# Patient Record
Sex: Female | Born: 1940 | Race: Black or African American | Hispanic: No | Smoking: Former smoker
Health system: Southern US, Community
[De-identification: ages and names within clinical notes are randomized; demographics above are authoritative.]

## PROBLEM LIST (undated history)

## (undated) DIAGNOSIS — E7800 Pure hypercholesterolemia, unspecified: Secondary | ICD-10-CM

## (undated) DIAGNOSIS — I10 Essential (primary) hypertension: Secondary | ICD-10-CM

## (undated) HISTORY — PX: OTHER SURGICAL HISTORY: SHX169

## (undated) HISTORY — PX: JOINT REPLACEMENT: SHX530

## (undated) HISTORY — PX: TOTAL HIP ARTHROPLASTY: SHX124

## (undated) HISTORY — PX: REPLACEMENT TOTAL KNEE BILATERAL: SUR1225

## (undated) HISTORY — PX: ABDOMINAL HYSTERECTOMY: SHX81

---

## 2017-04-14 ENCOUNTER — Other Ambulatory Visit: Payer: Self-pay

## 2017-04-14 ENCOUNTER — Encounter (HOSPITAL_COMMUNITY): Payer: Self-pay | Admitting: Family Medicine

## 2017-04-14 ENCOUNTER — Ambulatory Visit (HOSPITAL_COMMUNITY)
Admission: EM | Admit: 2017-04-14 | Discharge: 2017-04-14 | Disposition: A | Discharge status: Home / Self Care | Payer: Medicare Other | Attending: Family Medicine | Admitting: Family Medicine

## 2017-04-14 ENCOUNTER — Ambulatory Visit (INDEPENDENT_AMBULATORY_CARE_PROVIDER_SITE_OTHER): Payer: Medicare Other

## 2017-04-14 DIAGNOSIS — M25511 Pain in right shoulder: Secondary | ICD-10-CM

## 2017-04-14 HISTORY — DX: Pure hypercholesterolemia, unspecified: E78.00

## 2017-04-14 HISTORY — DX: Essential (primary) hypertension: I10

## 2017-04-14 MED ORDER — HYDROCODONE-ACETAMINOPHEN 5-325 MG PO TABS: 1.0000 | ORAL_TABLET | Freq: Four times a day (QID) | ORAL | 0 refills | Status: AC | PRN

## 2017-04-14 NOTE — Discharge Instructions (Signed)
Follow up with your orthopedist in [place].    CLINICAL DATA:  Status post fall today with right shoulder pain.   EXAM: RIGHT SHOULDER - 2+ VIEW   COMPARISON:  None.   FINDINGS: There is no evidence of fracture or dislocation. Degenerative joint changes of the right shoulder noted. Soft tissues are unremarkable.   IMPRESSION: No acute fracture or dislocation.     Electronically Signed   By: Sherian ReinWei-Chen  Lin M.D.   On: 04/14/2017 19:57

## 2017-04-14 NOTE — ED Provider Notes (Signed)
  [facility]   [id number] 04/14/17 Arrival Time: 1912   SUBJECTIVE:  Cassidy GunnerGwendolyn Hendricks is a 77 y.o. female who presents to the urgent care with complaint of right shoulder pain after a fall.  She was walking to the [employer] parking lot after dinner and fell.  She has a h/o rotator cuff injury in right shoulder that had healed with physcial therapy, and biceps tendon rupture left shoulder.  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    Past Medical History:  Diagnosis Date  . Hypercholesteremia   . Hypertension    No family history on file. Social History   Socioeconomic History  . Marital status: Divorced    Spouse name: Not on file  . Number of children: Not on file  . Years of education: Not on file  . Highest education level: Not on file  Social Needs  . Financial resource strain: Not on file  . Food insecurity - worry: Not on file  . Food insecurity - inability: Not on file  . Transportation needs - medical: Not on file  . Transportation needs - non-medical: Not on file  Occupational History  . Not on file  Tobacco Use  . Smoking status: Former Smoker  Substance and Sexual Activity  . Alcohol use: Yes    Comment: occasionally  . Drug use: Not on file  . Sexual activity: Not on file  Other Topics Concern  . Not on file  Social History Narrative  . Not on file   Current Meds  Medication Sig  . Acetaminophen (TYLENOL PO) Take by mouth.  . ATORVASTATIN CALCIUM PO Take by mouth.  . Celecoxib (CELEBREX PO) Take by mouth.  Marland Kitchen. LISINOPRIL-HYDROCHLOROTHIAZIDE PO Take by mouth.   No Known Allergies    ROS: As per HPI, remainder of ROS negative.   OBJECTIVE:   Vitals:   04/14/17 1930  BP: (!) 169/83  Pulse: 71  Resp: 18  Temp: 98.2 F (36.8 C)  TempSrc: Oral  SpO2: 100%     General appearance: alert; no distress Eyes: PERRL; EOMI; conjunctiva normal HENT: normocephalic; atraumatic;  Neck:  supple Extremities: no cyanosis or edema; symmetrical with no gross deformities, nontender right shoulder,  Skin: warm and dry Neurologic: normal gait; grossly normal Psychological: alert and cooperative; normal mood and affect    Labs Reviewed - No data to display  Dg Shoulder Right  Result Date: 04/14/2017 CLINICAL DATA:  Status post fall today with right shoulder pain. EXAM: RIGHT SHOULDER - 2+ VIEW COMPARISON:  None. FINDINGS: There is no evidence of fracture or dislocation. Degenerative joint changes of the right shoulder noted. Soft tissues are unremarkable. IMPRESSION: No acute fracture or dislocation. Electronically Signed   By: Sherian ReinWei-Chen  Lin M.D.   On: 04/14/2017 19:57       ASSESSMENT & PLAN:  1. Acute pain of right shoulder     Meds ordered this encounter  Medications  . HYDROcodone-acetaminophen (NORCO) 5-325 MG tablet    Sig: Take 1 tablet by mouth every 6 (six) hours as needed for moderate pain.    Dispense:  12 tablet    Refill:  0    Reviewed expectations re: course of current medical issues. Questions answered. Outlined signs and symptoms indicating need for more acute intervention. Patient verbalized understanding. After Visit Summary given.      Elvina SidleLauenstein, Alysandra Lobue, MD 04/14/17 2010

## 2017-04-14 NOTE — ED Triage Notes (Signed)
Assessment per Dr Lauenstein. 

## 2019-03-14 IMAGING — DX DG SHOULDER 2+V*R*
3 series · 3 of 3 positions shown · non-contrast
Comparison: None.

CLINICAL DATA: Status post fall today with right shoulder pain.

EXAM:
RIGHT SHOULDER - 2+ VIEW

[shoulder ap]
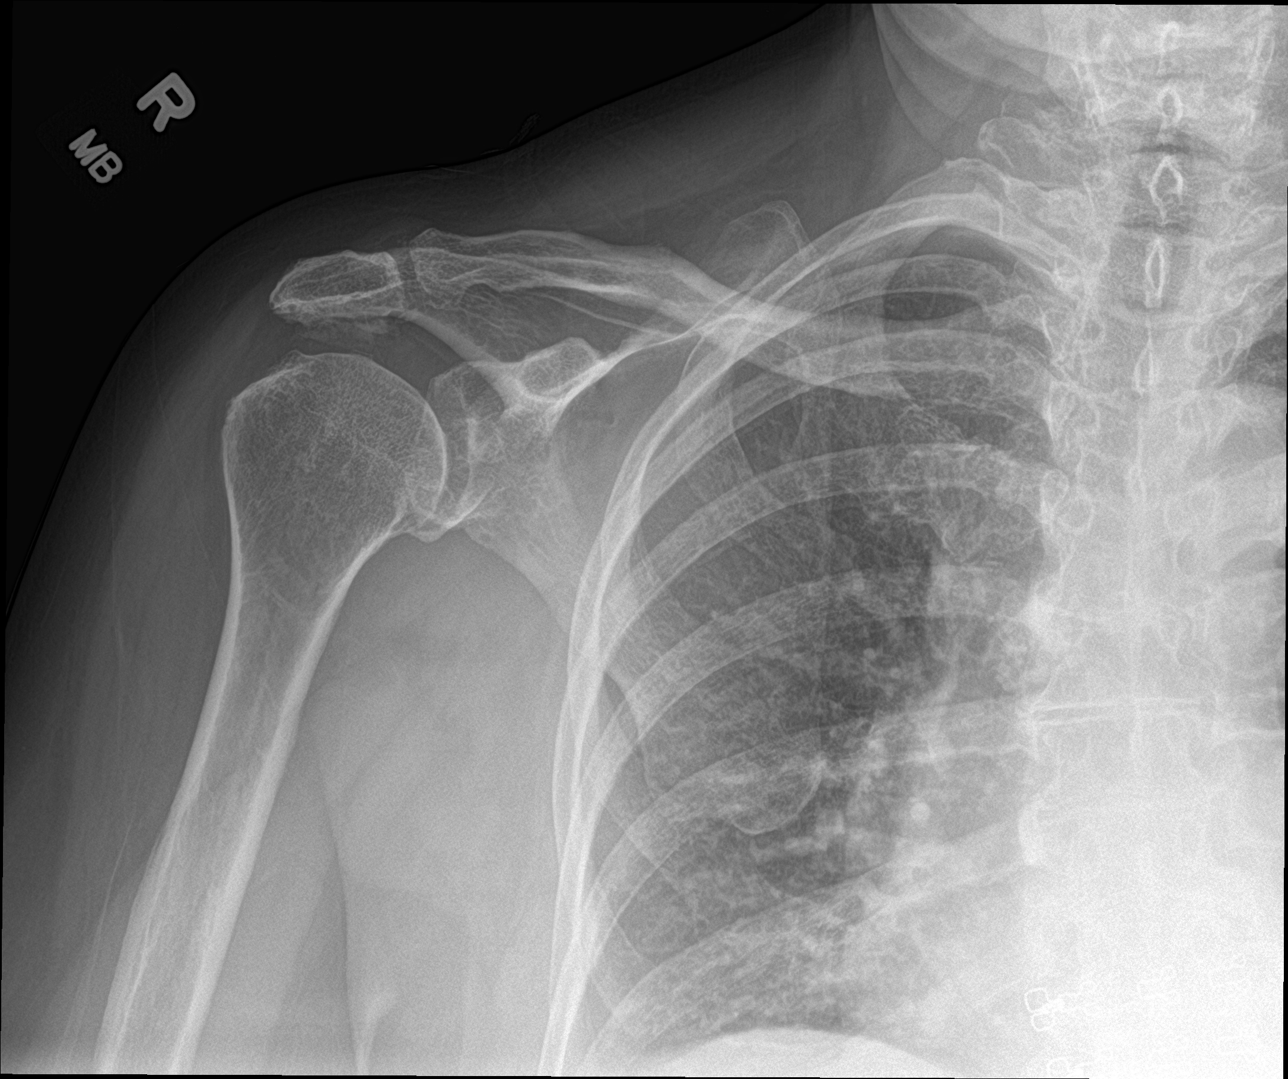

[shoulder grashey]
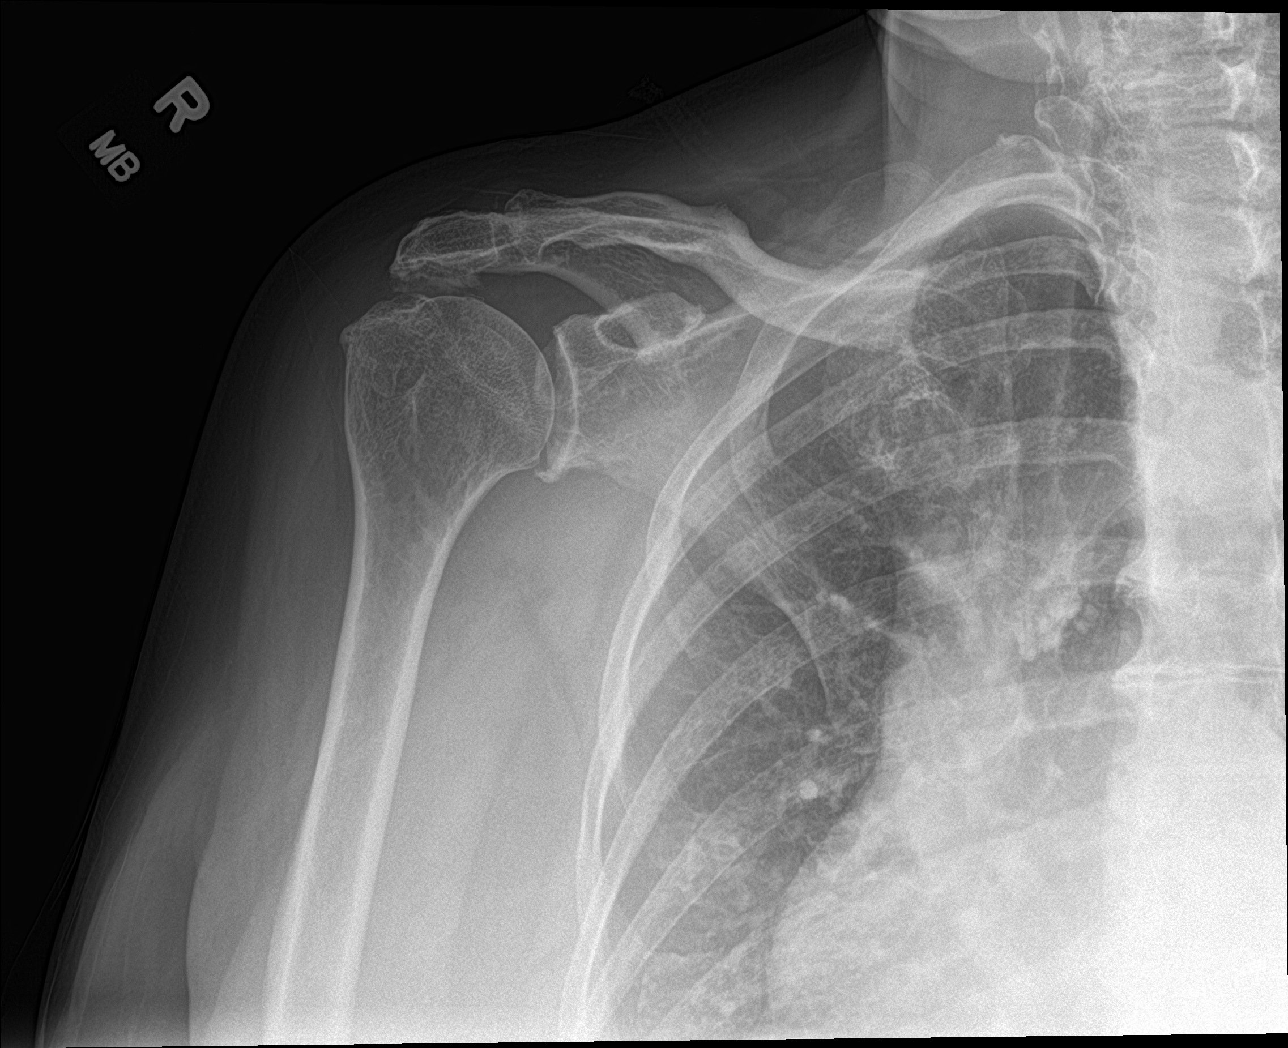

[shoulder y-view]
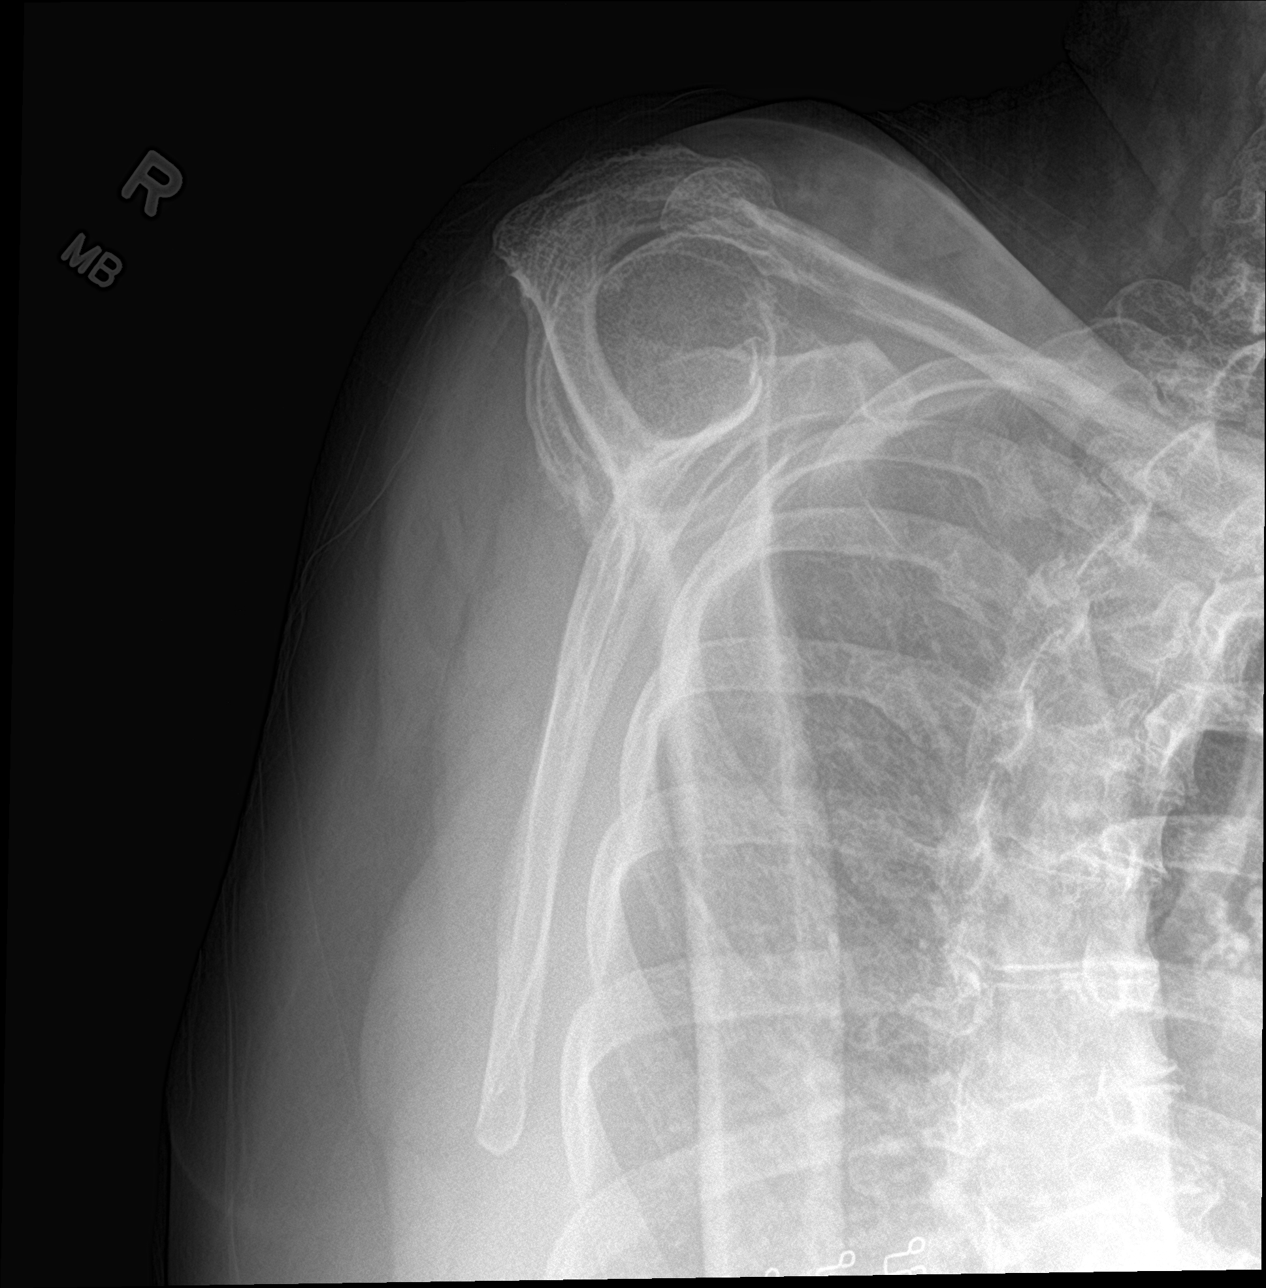

[3 of 3 positions shown; findings below may reference images not displayed]

FINDINGS: There is no evidence of fracture or dislocation. Degenerative joint
changes of the right shoulder noted. Soft tissues are unremarkable.
IMPRESSION: No acute fracture or dislocation.
